# Patient Record
Sex: Male | Born: 1945 | Race: White | Hispanic: No | Marital: Married | Smoking: Never smoker
Health system: Southern US, Community
[De-identification: ages and names within clinical notes are randomized; demographics above are authoritative.]

## PROBLEM LIST (undated history)

## (undated) DIAGNOSIS — E7800 Pure hypercholesterolemia, unspecified: Secondary | ICD-10-CM

## (undated) DIAGNOSIS — I219 Acute myocardial infarction, unspecified: Secondary | ICD-10-CM

## (undated) DIAGNOSIS — K635 Polyp of colon: Secondary | ICD-10-CM

## (undated) DIAGNOSIS — E119 Type 2 diabetes mellitus without complications: Secondary | ICD-10-CM

## (undated) DIAGNOSIS — C801 Malignant (primary) neoplasm, unspecified: Secondary | ICD-10-CM

## (undated) DIAGNOSIS — I2510 Atherosclerotic heart disease of native coronary artery without angina pectoris: Secondary | ICD-10-CM

## (undated) DIAGNOSIS — C61 Malignant neoplasm of prostate: Secondary | ICD-10-CM

## (undated) HISTORY — PX: COLON SURGERY: SHX602

---

## 2015-12-13 DIAGNOSIS — M1711 Unilateral primary osteoarthritis, right knee: Secondary | ICD-10-CM | POA: Insufficient documentation

## 2015-12-13 DIAGNOSIS — M545 Low back pain, unspecified: Secondary | ICD-10-CM | POA: Insufficient documentation

## 2015-12-13 DIAGNOSIS — M25562 Pain in left knee: Secondary | ICD-10-CM | POA: Insufficient documentation

## 2015-12-13 DIAGNOSIS — R251 Tremor, unspecified: Secondary | ICD-10-CM | POA: Insufficient documentation

## 2015-12-13 DIAGNOSIS — R0989 Other specified symptoms and signs involving the circulatory and respiratory systems: Secondary | ICD-10-CM | POA: Insufficient documentation

## 2015-12-13 DIAGNOSIS — I209 Angina pectoris, unspecified: Secondary | ICD-10-CM | POA: Insufficient documentation

## 2015-12-13 DIAGNOSIS — M2022 Hallux rigidus, left foot: Secondary | ICD-10-CM

## 2015-12-13 DIAGNOSIS — M159 Polyosteoarthritis, unspecified: Secondary | ICD-10-CM | POA: Insufficient documentation

## 2015-12-13 DIAGNOSIS — J309 Allergic rhinitis, unspecified: Secondary | ICD-10-CM | POA: Insufficient documentation

## 2015-12-13 DIAGNOSIS — M25519 Pain in unspecified shoulder: Secondary | ICD-10-CM | POA: Insufficient documentation

## 2015-12-13 DIAGNOSIS — E1142 Type 2 diabetes mellitus with diabetic polyneuropathy: Secondary | ICD-10-CM | POA: Insufficient documentation

## 2015-12-13 DIAGNOSIS — M2021 Hallux rigidus, right foot: Secondary | ICD-10-CM | POA: Insufficient documentation

## 2015-12-13 DIAGNOSIS — L301 Dyshidrosis [pompholyx]: Secondary | ICD-10-CM | POA: Insufficient documentation

## 2015-12-13 DIAGNOSIS — I2510 Atherosclerotic heart disease of native coronary artery without angina pectoris: Secondary | ICD-10-CM | POA: Insufficient documentation

## 2015-12-13 DIAGNOSIS — R5383 Other fatigue: Secondary | ICD-10-CM | POA: Insufficient documentation

## 2015-12-13 DIAGNOSIS — M75101 Unspecified rotator cuff tear or rupture of right shoulder, not specified as traumatic: Secondary | ICD-10-CM | POA: Insufficient documentation

## 2015-12-13 DIAGNOSIS — R413 Other amnesia: Secondary | ICD-10-CM | POA: Insufficient documentation

## 2015-12-13 DIAGNOSIS — J449 Chronic obstructive pulmonary disease, unspecified: Secondary | ICD-10-CM | POA: Insufficient documentation

## 2016-02-09 DIAGNOSIS — E119 Type 2 diabetes mellitus without complications: Secondary | ICD-10-CM | POA: Insufficient documentation

## 2016-02-09 DIAGNOSIS — E11319 Type 2 diabetes mellitus with unspecified diabetic retinopathy without macular edema: Secondary | ICD-10-CM | POA: Insufficient documentation

## 2016-02-09 DIAGNOSIS — R002 Palpitations: Secondary | ICD-10-CM | POA: Insufficient documentation

## 2016-02-09 DIAGNOSIS — I10 Essential (primary) hypertension: Secondary | ICD-10-CM | POA: Insufficient documentation

## 2016-03-08 DIAGNOSIS — E785 Hyperlipidemia, unspecified: Secondary | ICD-10-CM | POA: Insufficient documentation

## 2016-03-08 DIAGNOSIS — N189 Chronic kidney disease, unspecified: Secondary | ICD-10-CM | POA: Insufficient documentation

## 2016-08-01 DIAGNOSIS — F321 Major depressive disorder, single episode, moderate: Secondary | ICD-10-CM | POA: Insufficient documentation

## 2016-08-01 DIAGNOSIS — E663 Overweight: Secondary | ICD-10-CM | POA: Insufficient documentation

## 2016-09-21 DIAGNOSIS — L4050 Arthropathic psoriasis, unspecified: Secondary | ICD-10-CM | POA: Insufficient documentation

## 2016-10-10 DIAGNOSIS — R972 Elevated prostate specific antigen [PSA]: Secondary | ICD-10-CM | POA: Insufficient documentation

## 2016-10-10 DIAGNOSIS — N529 Male erectile dysfunction, unspecified: Secondary | ICD-10-CM | POA: Insufficient documentation

## 2018-05-13 DIAGNOSIS — C61 Malignant neoplasm of prostate: Secondary | ICD-10-CM | POA: Insufficient documentation

## 2018-09-18 DIAGNOSIS — M5412 Radiculopathy, cervical region: Secondary | ICD-10-CM | POA: Insufficient documentation

## 2018-09-18 DIAGNOSIS — M25531 Pain in right wrist: Secondary | ICD-10-CM | POA: Insufficient documentation

## 2018-10-18 ENCOUNTER — Other Ambulatory Visit: Payer: Self-pay

## 2018-10-18 ENCOUNTER — Emergency Department (HOSPITAL_BASED_OUTPATIENT_CLINIC_OR_DEPARTMENT_OTHER)
Admission: EM | Admit: 2018-10-18 | Discharge: 2018-10-18 | Disposition: A | Discharge status: Home / Self Care | Payer: Medicare Other | Attending: Emergency Medicine | Admitting: Emergency Medicine

## 2018-10-18 ENCOUNTER — Encounter (HOSPITAL_BASED_OUTPATIENT_CLINIC_OR_DEPARTMENT_OTHER): Payer: Self-pay | Admitting: *Deleted

## 2018-10-18 ENCOUNTER — Emergency Department (HOSPITAL_BASED_OUTPATIENT_CLINIC_OR_DEPARTMENT_OTHER): Payer: Medicare Other

## 2018-10-18 DIAGNOSIS — Z7982 Long term (current) use of aspirin: Secondary | ICD-10-CM | POA: Insufficient documentation

## 2018-10-18 DIAGNOSIS — Z8546 Personal history of malignant neoplasm of prostate: Secondary | ICD-10-CM | POA: Insufficient documentation

## 2018-10-18 DIAGNOSIS — Z79899 Other long term (current) drug therapy: Secondary | ICD-10-CM | POA: Diagnosis not present

## 2018-10-18 DIAGNOSIS — I96 Gangrene, not elsewhere classified: Secondary | ICD-10-CM | POA: Insufficient documentation

## 2018-10-18 DIAGNOSIS — E119 Type 2 diabetes mellitus without complications: Secondary | ICD-10-CM | POA: Insufficient documentation

## 2018-10-18 DIAGNOSIS — Z7984 Long term (current) use of oral hypoglycemic drugs: Secondary | ICD-10-CM | POA: Insufficient documentation

## 2018-10-18 DIAGNOSIS — M79674 Pain in right toe(s): Secondary | ICD-10-CM | POA: Diagnosis present

## 2018-10-18 HISTORY — DX: Malignant (primary) neoplasm, unspecified: C80.1

## 2018-10-18 HISTORY — DX: Polyp of colon: K63.5

## 2018-10-18 HISTORY — DX: Pure hypercholesterolemia, unspecified: E78.00

## 2018-10-18 HISTORY — DX: Malignant neoplasm of prostate: C61

## 2018-10-18 HISTORY — DX: Acute myocardial infarction, unspecified: I21.9

## 2018-10-18 HISTORY — DX: Type 2 diabetes mellitus without complications: E11.9

## 2018-10-18 HISTORY — DX: Atherosclerotic heart disease of native coronary artery without angina pectoris: I25.10

## 2018-10-18 LAB — BASIC METABOLIC PANEL
Anion gap: 7 (ref 5–15)
BUN: 24 mg/dL — ABNORMAL HIGH (ref 8–23)
CO2: 21 mmol/L — ABNORMAL LOW (ref 22–32)
Calcium: 8.7 mg/dL — ABNORMAL LOW (ref 8.9–10.3)
Chloride: 111 mmol/L (ref 98–111)
Creatinine, Ser: 1.56 mg/dL — ABNORMAL HIGH (ref 0.61–1.24)
GFR calc Af Amer: 50 mL/min — ABNORMAL LOW (ref >60)
GFR calc non Af Amer: 43 mL/min — ABNORMAL LOW (ref >60)
Glucose, Bld: 188 mg/dL — ABNORMAL HIGH (ref 70–99)
Potassium: 4.2 mmol/L (ref 3.5–5.1)
Sodium: 139 mmol/L (ref 135–145)

## 2018-10-18 LAB — CBC WITH DIFFERENTIAL/PLATELET
Abs Immature Granulocytes: 0.03 10*3/uL (ref 0.00–0.07)
Basophils Absolute: 0 10*3/uL (ref 0.0–0.1)
Basophils Relative: 0 %
Eosinophils Absolute: 0.2 10*3/uL (ref 0.0–0.5)
Eosinophils Relative: 2 %
HCT: 41.1 % (ref 39.0–52.0)
Hemoglobin: 13.2 g/dL (ref 13.0–17.0)
Immature Granulocytes: 0 %
Lymphocytes Relative: 14 %
Lymphs Abs: 1.4 10*3/uL (ref 0.7–4.0)
MCH: 32.1 pg (ref 26.0–34.0)
MCHC: 32.1 g/dL (ref 30.0–36.0)
MCV: 100 fL (ref 80.0–100.0)
Monocytes Absolute: 0.9 10*3/uL (ref 0.1–1.0)
Monocytes Relative: 8 %
Neutro Abs: 7.6 10*3/uL (ref 1.7–7.7)
Neutrophils Relative %: 76 %
Platelets: 260 10*3/uL (ref 150–400)
RBC: 4.11 MIL/uL — ABNORMAL LOW (ref 4.22–5.81)
RDW: 13.3 % (ref 11.5–15.5)
WBC: 10.2 10*3/uL (ref 4.0–10.5)
nRBC: 0 % (ref 0.0–0.2)

## 2018-10-18 MED ORDER — DOXYCYCLINE HYCLATE 100 MG PO CAPS: 100.0000 mg | ORAL_CAPSULE | Freq: Two times a day (BID) | ORAL | 0 refills | Status: AC

## 2018-10-18 NOTE — ED Notes (Signed)
Patient has his own post-op shoe.

## 2018-10-18 NOTE — ED Provider Notes (Signed)
[facility] EMERGENCY DEPARTMENT Provider Note   CSN: [id number] Arrival date & time: 10/18/18  1312     History   Chief Complaint Chief Complaint  Patient presents with  . Wound Check    HPI Tyler Vazquez is a 73 y.o. male with history of CAD, diabetes who presents with 1 week history of right toe pain.  Patient hit it on a door stop.    Patient's toe has began to turn black and began draining today. He went to urgent care yesterday and had an x-ray which was apparently negative, however I am unable to find it on chart review. He was told yesterday that he needed to see a wound care center and was prescribed antibiotics, which he has not filled yet.  He is unsure what was prescribed to him.  He reports having pain when he walks to his toe.  He denies any fevers, chest pain, shortness of breath, abdominal pain, nausea, vomiting.  HPI  Past Medical History:  Diagnosis Date  . Cancer ([place])   . Colon polyps   . Coronary artery disease   . Diabetes mellitus without complication ([place])   . Heart attack ([place])   . High cholesterol   . Prostate cancer ([place])     There are no active problems to display for this patient.   Past Surgical History:  Procedure Laterality Date  . COLON SURGERY          Home Medications    Prior to Admission medications   Medication Sig Start Date End Date Taking? Authorizing Provider  aspirin EC 81 MG tablet Take 81 mg by mouth daily.   Yes [provider]  glimepiride (AMARYL) 2 MG tablet Take 2 mg by mouth at bedtime.   Yes [provider]  glimepiride (AMARYL) 4 MG tablet Take 4 mg by mouth daily with breakfast.   Yes [provider]  metoprolol tartrate (LOPRESSOR) 25 MG tablet Take 25 mg by mouth daily.   Yes [provider]  sitaGLIPtin-metformin (JANUMET) 50-1000 MG tablet Take 1 tablet by mouth 2 (two) times daily with a meal.   Yes [provider]  doxycycline (VIBRAMYCIN) 100 MG capsule  Take 1 capsule (100 mg total) by mouth 2 (two) times daily. 10/18/18   Frederica Kuster, PA-C    Family History No family history on file.  Social History Social History   Tobacco Use  . Smoking status: Never Smoker  . Smokeless tobacco: Never Used  Substance Use Topics  . Alcohol use: Never    Frequency: Never  . Drug use: Never     Allergies   Codeine   Review of Systems Review of Systems  Constitutional: Negative for chills and fever.  HENT: Negative for facial swelling and sore throat.   Respiratory: Negative for shortness of breath.   Cardiovascular: Negative for chest pain.  Gastrointestinal: Negative for abdominal pain, nausea and vomiting.  Genitourinary: Negative for dysuria.  Musculoskeletal: Negative for back pain.  Skin: Positive for color change and wound. Negative for rash.  Neurological: Negative for headaches.  Psychiatric/Behavioral: The patient is not nervous/anxious.      Physical Exam Updated Vital Signs BP 105/82 (BP Location: Right Arm)   Pulse 99   Temp 97.9 F (36.6 C) (Oral)   Resp 16   Ht 5\' 4"  (1.626 m)   Wt 74.8 kg   SpO2 98%   BMI 28.32 kg/m   Physical Exam Vitals signs and nursing note reviewed.  Constitutional:      General: He is not in acute distress.    Appearance: He is well-developed. He is not diaphoretic.  HENT:     Head: Normocephalic and atraumatic.     Mouth/Throat:     Pharynx: No oropharyngeal exudate.  Eyes:     General: No scleral icterus.       Right eye: No discharge.        Left eye: No discharge.     Conjunctiva/sclera: Conjunctivae normal.     Pupils: Pupils are equal, round, and reactive to light.  Neck:     Musculoskeletal: Normal range of motion and neck supple.     Thyroid: No thyromegaly.  Cardiovascular:     Rate and Rhythm: Normal rate and regular rhythm.     Heart sounds: Normal heart sounds. No murmur. No friction rub. No gallop.   Pulmonary:     Effort: Pulmonary effort is normal. No  respiratory distress.     Breath sounds: Normal breath sounds. No stridor. No wheezing or rales.  Abdominal:     General: Bowel sounds are normal. There is no distension.     Palpations: Abdomen is soft.     Tenderness: There is no abdominal tenderness. There is no guarding or rebound.  Musculoskeletal:     Comments: Tenderness, erythema, and lack area to medial aspect of right fifth digit, no active drainage DP pulses intact bilaterally  Lymphadenopathy:     Cervical: No cervical adenopathy.  Skin:    General: Skin is warm and dry.     Coloration: Skin is not pale.     Findings: No rash.  Neurological:     Mental Status: He is alert.     Coordination: Coordination normal.          ED Treatments / Results  Labs (all labs ordered are listed, but only abnormal results are displayed) Labs Reviewed  BASIC METABOLIC PANEL - Abnormal; Notable for the following components:      Result Value   CO2 21 (*)    Glucose, Bld 188 (*)    BUN 24 (*)    Creatinine, Ser 1.56 (*)    Calcium 8.7 (*)    GFR calc non Af Amer 43 (*)    GFR calc Af Amer 50 (*)    All other components within normal limits  CBC WITH DIFFERENTIAL/PLATELET - Abnormal; Notable for the following components:   RBC 4.11 (*)    All other components within normal limits    EKG None  Radiology Dg Toe 5th Right  Result Date: 10/18/2018 CLINICAL DATA:  The patient suffered a blow to the right fifth toe on a piece of metal today. Initial encounter. EXAM: RIGHT FIFTH TOE COMPARISON:  None. FINDINGS: There is no evidence of fracture or dislocation. There is no evidence of arthropathy or other focal bone abnormality. Soft tissues are unremarkable. IMPRESSION: Negative exam. Electronically Signed   By: Inge Rise M.D.   On: 10/18/2018 15:36    Procedures Procedures (including critical care time)  Medications Ordered in ED Medications - No data to display   Initial Impression / Assessment and Plan / ED Course    I have reviewed the triage vital signs and the nursing notes.  Pertinent labs & imaging results that were available during my care of the patient were reviewed by me and considered in my medical decision making (see chart for details).     Patient presenting with necrotic appearing right fifth  digit.  Labs are stable for the patient.  There is no leukocytosis.  He is afebrile and shows no systemic signs of infection.  X-ray is negative.  Will treat with doxycycline with close follow-up to podiatry as well as wound care.  Patient has a postop shoe at home.  He denies any pain unless he is walking on it.  Return precautions discussed.  Patient understands and agrees with plan.  Patient vital stable throughout ED course and discharged in satisfactory condition.  Patient also evaluated by my attending, Dr. Reather Converse, who guided the patient's management and agrees with plan.  Final Clinical Impressions(s) / ED Diagnoses   Final diagnoses:  Toe necrosis [facility])    ED Discharge Orders         Ordered    doxycycline (VIBRAMYCIN) 100 MG capsule  2 times daily     10/18/18 [address] [place] 10/18/18 1657    Elnora Morrison, MD 10/18/18 [phone]

## 2018-10-18 NOTE — ED Triage Notes (Signed)
Pt reports injured right 5th pinkie toe over a week ago. States now has draining wound. He went to Urgent Care and was told the toe was "dead". Pt is a diabetic

## 2018-10-18 NOTE — Discharge Instructions (Signed)
Take doxycycline until completed.  Please follow-up with Dr. Marla Roe, wound care doctor, as well as the podiatrist, Dr. Jacqualyn Posey, for further evaluation and treatment of your toe.  It is important that you follow-up with these people to prevent spread of tissue death.  You may end up needing an amputation of your toe.  Please return to emergency department if develop increasing pain or redness spreading of your foot, or fevers over 100.4.

## 2018-10-21 ENCOUNTER — Other Ambulatory Visit: Payer: Self-pay

## 2018-10-21 ENCOUNTER — Ambulatory Visit: Payer: Medicare Other | Admitting: Podiatry

## 2018-10-21 ENCOUNTER — Encounter: Payer: Self-pay | Admitting: Podiatry

## 2018-10-21 VITALS — BP 117/68 | HR 77 | Temp 98.1°F | Resp 16

## 2018-10-21 DIAGNOSIS — L02611 Cutaneous abscess of right foot: Secondary | ICD-10-CM

## 2018-10-21 DIAGNOSIS — S91104A Unspecified open wound of right lesser toe(s) without damage to nail, initial encounter: Secondary | ICD-10-CM | POA: Diagnosis not present

## 2018-10-21 DIAGNOSIS — S9031XA Contusion of right foot, initial encounter: Secondary | ICD-10-CM | POA: Diagnosis not present

## 2018-10-21 DIAGNOSIS — L03031 Cellulitis of right toe: Secondary | ICD-10-CM | POA: Diagnosis not present

## 2018-10-21 DIAGNOSIS — M79674 Pain in right toe(s): Secondary | ICD-10-CM

## 2018-10-21 DIAGNOSIS — M7989 Other specified soft tissue disorders: Secondary | ICD-10-CM

## 2018-10-21 MED ORDER — SILVER SULFADIAZINE 1 % EX CREA: TOPICAL_CREAM | CUTANEOUS | 0 refills | Status: AC

## 2018-10-22 DIAGNOSIS — E11628 Type 2 diabetes mellitus with other skin complications: Secondary | ICD-10-CM | POA: Insufficient documentation

## 2018-10-22 DIAGNOSIS — L089 Local infection of the skin and subcutaneous tissue, unspecified: Secondary | ICD-10-CM

## 2018-10-22 NOTE — Progress Notes (Signed)
Subjective:  Patient ID: Tyler Vazquez, male    DOB: 06/10/1946,  MRN: [id number]  Chief Complaint  Patient presents with  . Toe Pain    R 5th toe dislcored, redness, and swollen x 1 wk; 10/10 sharp, burning, tinling sensation -pt states it started after he hit his toe on a door Tx: hemp and aspercreme -pt denies N/V/F/Ch -w/ bloody draiange -FBS: 165 A1C: 7 PCP: Spry x months    73 y.o. male presents for wound care.  States that he hit his toe on a car door which caused the toe to become bruised and black reports pain worse at night.  Was seen in urgent care x-rays were taken no signs of fracture or erosion.  Was referred here by wound care center who advised that the toe may need to be removed.  Review of Systems: Negative except as noted in the HPI. Denies N/V/F/Ch.  Past Medical History:  Diagnosis Date  . Cancer ([place])   . Colon polyps   . Coronary artery disease   . Diabetes mellitus without complication ([place])   . Heart attack ([place])   . High cholesterol   . Prostate cancer [facility])     Current Outpatient Medications:  .  aspirin EC 81 MG tablet, Take 81 mg by mouth daily., Disp: , Rfl:  .  azelastine (ASTELIN) 0.1 % nasal spray, Place into the nose., Disp: , Rfl:  .  B Complex Vitamins (VITAMIN B-COMPLEX) TABS, Take by mouth., Disp: , Rfl:  .  clotrimazole-betamethasone (LOTRISONE) cream, Apply topically., Disp: , Rfl:  .  diclofenac sodium (VOLTAREN) 1 % GEL, Apply up to 4gm of gel to region of pain up to 4 times a day, Disp: , Rfl:  .  doxycycline (VIBRAMYCIN) 100 MG capsule, Take 1 capsule (100 mg total) by mouth 2 (two) times daily., Disp: 20 capsule, Rfl: 0 .  DULoxetine (CYMBALTA) 30 MG capsule, Take by mouth., Disp: , Rfl:  .  glimepiride (AMARYL) 2 MG tablet, Take 2 mg by mouth at bedtime., Disp: , Rfl:  .  glimepiride (AMARYL) 4 MG tablet, Take 4 mg by mouth daily with breakfast., Disp: , Rfl:  .  glucose blood (PRECISION QID TEST) test strip, Test daily dx dm type 2,  Disp: , Rfl:  .  leflunomide (ARAVA) 10 MG tablet, Take by mouth., Disp: , Rfl:  .  magnesium oxide (MAG-OX) 400 MG tablet, Take by mouth., Disp: , Rfl:  .  metoprolol tartrate (LOPRESSOR) 25 MG tablet, Take 25 mg by mouth daily., Disp: , Rfl:  .  nitroGLYCERIN (NITROSTAT) 0.4 MG SL tablet, Place under the tongue., Disp: , Rfl:  .  rosuvastatin (CRESTOR) 10 MG tablet, TAKE 1 TABLET BY MOUTH NIGHTLY, Disp: , Rfl:  .  sildenafil (REVATIO) 20 MG tablet, Take 1 to 5 tablets 1 hour before sex.  Do not take nitrates., Disp: , Rfl:  .  sitaGLIPtin-metformin (JANUMET) 50-1000 MG tablet, Take 1 tablet by mouth 2 (two) times daily with a meal., Disp: , Rfl:  .  triamcinolone cream (KENALOG) 0.1 %, Apply topically., Disp: , Rfl:  .  silver sulfADIAZINE (SILVADENE) 1 % cream, Apply pea-sized amount to wound every other day. Cover with band-aid., Disp: 50 g, Rfl: 0  Social History   Tobacco Use  Smoking Status Never Smoker  Smokeless Tobacco Never Used    Allergies  Allergen Reactions  . Bufferin  [Buffered Aspirin] Anaphylaxis  . Codeine Swelling  . Gabapentin Rash    Other  reaction(s): Tremor (intolerance) Other reaction(s): Tremor (intolerance)    Objective:   Vitals:   10/21/18 1015  BP: 117/68  Pulse: 77  Resp: 16  Temp: 98.1 F (36.7 C)   There is no height or weight on file to calculate BMI. Constitutional Well developed. Well nourished.  Vascular Dorsalis pedis pulses palpable bilaterally. Posterior tibial pulses palpable bilaterally. Capillary refill normal to all digits.  No cyanosis or clubbing noted. Pedal hair growth normal.  Neurologic Normal speech. Oriented to person, place, and time. Protective sensation intact  Dermatologic N.B.: Pictures taken at time of visit did not upload, these images from hospital encounter 10/18/2018   Right fifth toe medial unstageable eschar slight periwound contusion wound appears with some fibrosis.  No excessive warmth some periwound  redness though this is likely more due to bruising  Orthopedic:  Pain to palpation about the right fifth toe   Radiographs: X-rays reviewed from the hospital.  No osseous erosions Assessment:   1. Contusion of right foot, initial encounter   2. Open wound of fifth toe of right foot, initial encounter   3. Pain and swelling of toe, right   4. Cellulitis and abscess of toe of right foot    Plan:  Patient was evaluated and treated and all questions answered.  Deep contusion right foot, wound right foot -X-rays reviewed -I discussed with patient that at this time I do not think the toe needs to be removed.  At this point we can proceed with close monitoring of the digit to see if the eschar will resolve with good skin underneath the toe.  He has good pulses and good pedal hair and I think from a vascular perspective there is a chance that the toe can heal without amputation.  I still did discuss that there is a chance that the toe may not survive he may need to undergo amputation.  He may need to undergo debridement to promote healing. -Rx Keflex prophylactically -Dressed with Silvadene and Band-Aid -Rx Silvadene.  Advised to apply every other day. -Would consider switching to Santyl at next visit if eschar remains.   No follow-ups on file.

## 2018-10-23 ENCOUNTER — Telehealth: Payer: Self-pay | Admitting: Podiatry

## 2018-10-23 MED ORDER — TRAMADOL HCL 50 MG PO TABS: 50.0000 mg | ORAL_TABLET | Freq: Three times a day (TID) | ORAL | 0 refills | Status: AC | PRN

## 2018-10-23 NOTE — Telephone Encounter (Signed)
Patient's wife left voicemail Wednesday evening stating that her husband is still in pain and is now requesting pain rx. He was seen in [place] by Dr. March Rummage on 10/21/18. Please advise.Tyler KitchenMarland Vazquez

## 2018-10-23 NOTE — Addendum Note (Signed)
Addended by: Hardie Pulley on: 10/23/2018 08:38 AM   Modules accepted: Orders

## 2018-10-23 NOTE — Telephone Encounter (Signed)
Dr. March Rummage Please advise.  No mention in note.

## 2018-10-23 NOTE — Telephone Encounter (Signed)
Notified patient that an Rx was sent to his pharmacy

## 2018-10-28 ENCOUNTER — Encounter: Payer: Self-pay | Admitting: Podiatry

## 2018-10-28 ENCOUNTER — Ambulatory Visit: Payer: Medicare Other | Admitting: Podiatry

## 2018-10-28 VITALS — BP 124/74 | HR 80 | Temp 96.9°F | Resp 16

## 2018-10-28 DIAGNOSIS — L03031 Cellulitis of right toe: Secondary | ICD-10-CM

## 2018-10-28 DIAGNOSIS — M79674 Pain in right toe(s): Secondary | ICD-10-CM | POA: Diagnosis not present

## 2018-10-28 DIAGNOSIS — S9031XD Contusion of right foot, subsequent encounter: Secondary | ICD-10-CM

## 2018-10-28 DIAGNOSIS — L02611 Cutaneous abscess of right foot: Secondary | ICD-10-CM

## 2018-10-28 DIAGNOSIS — S91104D Unspecified open wound of right lesser toe(s) without damage to nail, subsequent encounter: Secondary | ICD-10-CM | POA: Diagnosis not present

## 2018-10-28 DIAGNOSIS — M7989 Other specified soft tissue disorders: Secondary | ICD-10-CM

## 2018-10-28 NOTE — Progress Notes (Signed)
Subjective:  Patient ID: Tyler Vazquez, male    DOB: 1946/07/30,  MRN: [id number]  Chief Complaint  Patient presents with  . Wound Check    F/U R 5th toe contusion and wound check Pt.s tates," doing better. I don't hurt like I did (1-2/10 sorness). It's starting to look a little better." Tx: keflex, silvadene and vaseline -pt denies N/V?F/Ch -w/ bloody drainage, toe discoloration and redness    73 y.o. male presents for wound care. History above confirmed with patient.  Review of Systems: Negative except as noted in the HPI. Denies N/V/F/Ch.  Past Medical History:  Diagnosis Date  . Cancer ([place])   . Colon polyps   . Coronary artery disease   . Diabetes mellitus without complication ([place])   . Heart attack ([place])   . High cholesterol   . Prostate cancer [facility])     Current Outpatient Medications:  .  aspirin EC 81 MG tablet, Take 81 mg by mouth daily., Disp: , Rfl:  .  azelastine (ASTELIN) 0.1 % nasal spray, Place into the nose., Disp: , Rfl:  .  B Complex Vitamins (VITAMIN B-COMPLEX) TABS, Take by mouth., Disp: , Rfl:  .  clotrimazole-betamethasone (LOTRISONE) cream, Apply topically., Disp: , Rfl:  .  diclofenac sodium (VOLTAREN) 1 % GEL, Apply up to 4gm of gel to region of pain up to 4 times a day, Disp: , Rfl:  .  doxycycline (VIBRAMYCIN) 100 MG capsule, Take 1 capsule (100 mg total) by mouth 2 (two) times daily., Disp: 20 capsule, Rfl: 0 .  DULoxetine (CYMBALTA) 30 MG capsule, Take by mouth., Disp: , Rfl:  .  glimepiride (AMARYL) 2 MG tablet, Take 2 mg by mouth at bedtime., Disp: , Rfl:  .  glimepiride (AMARYL) 4 MG tablet, Take 4 mg by mouth daily with breakfast., Disp: , Rfl:  .  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  leflunomide (ARAVA) 10 MG tablet, Take by mouth., Disp: , Rfl:  .  magnesium oxide (MAG-OX) 400 MG tablet, Take by mouth., Disp: , Rfl:  .  metoprolol tartrate (LOPRESSOR) 25 MG tablet, Take 25 mg by mouth daily.,  Disp: , Rfl:  .  nitroGLYCERIN (NITROSTAT) 0.4 MG SL tablet, Place under the tongue., Disp: , Rfl:  .  rosuvastatin (CRESTOR) 10 MG tablet, TAKE 1 TABLET BY MOUTH NIGHTLY, Disp: , Rfl:  .  sildenafil (REVATIO) 20 MG tablet, Take 1 to 5 tablets 1 hour before sex.  Do not take nitrates., Disp: , Rfl:  .  silver sulfADIAZINE (SILVADENE) 1 % cream, Apply pea-sized amount to wound every other day. Cover with band-aid., Disp: 50 g, Rfl: 0 .  sitaGLIPtin-metformin (JANUMET) 50-1000 MG tablet, Take 1 tablet by mouth 2 (two) times daily with a meal., Disp: , Rfl:  .  traMADol (ULTRAM) 50 MG tablet, Take 1 tablet (50 mg total) by mouth every 8 (eight) hours as needed., Disp: 15 tablet, Rfl: 0 .  triamcinolone cream (KENALOG) 0.1 %, Apply topically., Disp: , Rfl:   Social History   Tobacco Use  Smoking Status Never Smoker  Smokeless Tobacco Never Used    Allergies  Allergen Reactions  . Bufferin  [Buffered Aspirin] Anaphylaxis  . Codeine Swelling  . Gabapentin Rash    Other reaction(s): Tremor (intolerance) Other reaction(s): Tremor (intolerance)    Objective:   Vitals:   10/28/18 1119  BP: 124/74  Pulse: 80  Resp: 16  Temp: (!)  96.9 F (36.1 C)   There is no height or weight on file to calculate BMI. Constitutional Well developed. Well nourished.  Vascular Dorsalis pedis pulses palpable bilaterally. Posterior tibial pulses palpable bilaterally. Capillary refill normal to all digits.  No cyanosis or clubbing noted. Pedal hair growth normal.  Neurologic Normal speech. Oriented to person, place, and time. Protective sensation intact  Dermatologic  Right 5th toe wound improving, unstageable. Eschar remains. No warmth or erythema. No signs of acute infection. Psoriasis plaque plantar right foot.  Orthopedic: Pain to palpation about the right fifth toe   Radiographs: None Assessment:   1. Open wound of fifth toe of right foot, subsequent encounter   2. Contusion of right foot,  subsequent encounter   3. Pain and swelling of toe, right   4. Cellulitis and abscess of toe of right foot    Plan:  Patient was evaluated and treated and all questions answered.  Deep contusion right foot, wound right foot -Wound appears to be healing. At this point I do not think we need to proceed with amputation. -Dressed with Silvadene and Band-Aid -Continue dressing with silvadene qOD -Would consider switching to Santyl at next visit if eschar remains slow to resolve.  Return in about 1 week (around 11/04/2018) for wound check right 5th toe.

## 2018-10-30 NOTE — Addendum Note (Signed)
Addended by: Allean Found on: 10/30/2018 08:31 AM   Modules accepted: Orders

## 2018-11-04 ENCOUNTER — Ambulatory Visit: Payer: Medicare Other | Admitting: Podiatry

## 2018-11-04 ENCOUNTER — Other Ambulatory Visit: Payer: Self-pay

## 2018-11-04 ENCOUNTER — Encounter: Payer: Self-pay | Admitting: Podiatry

## 2018-11-04 VITALS — BP 88/57 | HR 82 | Temp 97.1°F | Resp 16

## 2018-11-04 DIAGNOSIS — S91104D Unspecified open wound of right lesser toe(s) without damage to nail, subsequent encounter: Secondary | ICD-10-CM

## 2018-11-15 NOTE — Progress Notes (Signed)
Subjective:  Patient ID: Tyler Vazquez, male    DOB: Jun 01, 1946,  MRN: [id number]  Chief Complaint  Patient presents with  . Foot Ulcer    F/I L 5ht toe wound check Pt. sates," seems to be ding alright, it hurts a little; 6/10 intermittetn painm." tx: silvadene -pt deneis N/V/F?Ch     73 y.o. male presents for wound care. History above confirmed with patient.  Review of Systems: Negative except as noted in the HPI. Denies N/V/F/Ch.  Past Medical History:  Diagnosis Date  . Cancer ([place])   . Colon polyps   . Coronary artery disease   . Diabetes mellitus without complication ([place])   . Heart attack ([place])   . High cholesterol   . Prostate cancer [facility])     Current Outpatient Medications:  .  aspirin EC 81 MG tablet, Take 81 mg by mouth daily., Disp: , Rfl:  .  azelastine (ASTELIN) 0.1 % nasal spray, Place into the nose., Disp: , Rfl:  .  B Complex Vitamins (VITAMIN B-COMPLEX) TABS, Take by mouth., Disp: , Rfl:  .  clotrimazole-betamethasone (LOTRISONE) cream, Apply topically., Disp: , Rfl:  .  Coenzyme Q10 100 MG TABS, Take by mouth., Disp: , Rfl:  .  diclofenac sodium (VOLTAREN) 1 % GEL, Apply up to 4gm of gel to region of pain up to 4 times a day, Disp: , Rfl:  .  doxycycline (VIBRAMYCIN) 100 MG capsule, Take 1 capsule (100 mg total) by mouth 2 (two) times daily., Disp: 20 capsule, Rfl: 0 .  DULoxetine (CYMBALTA) 30 MG capsule, Take by mouth., Disp: , Rfl:  .  glimepiride (AMARYL) 2 MG tablet, Take 2 mg by mouth at bedtime., Disp: , Rfl:  .  glimepiride (AMARYL) 4 MG tablet, Take 4 mg by mouth daily with breakfast., Disp: , Rfl:  .  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  leflunomide (ARAVA) 10 MG tablet, Take by mouth., Disp: , Rfl:  .  magnesium oxide (MAG-OX) 400 MG tablet, Take by mouth., Disp: , Rfl:  .  metoprolol tartrate (LOPRESSOR) 25 MG tablet, Take 25 mg by mouth daily., Disp: , Rfl:  .  nitroGLYCERIN (NITROSTAT) 0.4 MG SL  tablet, Place under the tongue., Disp: , Rfl:  .  rosuvastatin (CRESTOR) 10 MG tablet, TAKE 1 TABLET BY MOUTH NIGHTLY, Disp: , Rfl:  .  sildenafil (REVATIO) 20 MG tablet, Take 1 to 5 tablets 1 hour before sex.  Do not take nitrates., Disp: , Rfl:  .  silver sulfADIAZINE (SILVADENE) 1 % cream, Apply pea-sized amount to wound every other day. Cover with band-aid., Disp: 50 g, Rfl: 0 .  sitaGLIPtin-metformin (JANUMET) 50-1000 MG tablet, Take 1 tablet by mouth 2 (two) times daily with a meal., Disp: , Rfl:  .  traMADol (ULTRAM) 50 MG tablet, Take 1 tablet (50 mg total) by mouth every 8 (eight) hours as needed., Disp: 15 tablet, Rfl: 0 .  triamcinolone cream (KENALOG) 0.1 %, Apply topically., Disp: , Rfl:   Social History   Tobacco Use  Smoking Status Never Smoker  Smokeless Tobacco Never Used    Allergies  Allergen Reactions  . Bufferin  [Buffered Aspirin] Anaphylaxis  . Codeine Swelling  . Gabapentin Rash    Other reaction(s): Tremor (intolerance) Other reaction(s): Tremor (intolerance)    Objective:   Vitals:   11/04/18 1127  BP: (!) 88/57  Pulse: 82  Resp: 16  Temp: (!) 97.1  F (36.2 C)   There is no height or weight on file to calculate BMI. Constitutional Well developed. Well nourished.  Vascular Dorsalis pedis pulses palpable bilaterally. Posterior tibial pulses palpable bilaterally. Capillary refill normal to all digits.  No cyanosis or clubbing noted. Pedal hair growth normal.  Neurologic Normal speech. Oriented to person, place, and time. Protective sensation intact  Dermatologic Right 5th toe wound improving, eschar resolving. No warmth or erythema. No signs of acute infection.  0.3 x 0.3 open wound following debridement Psoriasis plaque plantar right foot.  Orthopedic: Pain to palpation about the right fifth toe   Radiographs: None Assessment:   1. Open wound of fifth toe of right foot, subsequent encounter    Plan:  Patient was evaluated and treated and  all questions answered.  Deep contusion right foot, wound right foot -Wound continues to improve eschar appears resolving.  Gently debrided with tissue nipper -Likely will not need amputation.  Advised to follow-up promptly should also worsen  Procedure: Selective Debridement of Wound Rationale: Removal of devitalized tissue from the wound to promote healing.  Pre-Debridement Wound Measurements: 0.3 cm x 0.3 cm x 0.1 cm  Post-Debridement Wound Measurements: same as pre-debridement. Type of Debridement: sharp selective Tissue Removed: Devitalized soft-tissue Dressing: Dry, sterile, compression dressing. Disposition: Patient tolerated procedure well. Patient to return in 1 week for follow-up.    No follow-ups on file.

## 2018-11-18 ENCOUNTER — Encounter: Payer: Self-pay | Admitting: Podiatry

## 2018-11-18 ENCOUNTER — Ambulatory Visit: Payer: Medicare Other | Admitting: Podiatry

## 2018-11-18 VITALS — BP 129/75 | HR 88 | Temp 97.1°F | Resp 16

## 2018-11-18 DIAGNOSIS — S91104D Unspecified open wound of right lesser toe(s) without damage to nail, subsequent encounter: Secondary | ICD-10-CM | POA: Diagnosis not present

## 2018-11-18 NOTE — Progress Notes (Signed)
Subjective:  Patient ID: Tyler Vazquez, male    DOB: 1946/02/28,  MRN: [id number]  Chief Complaint  Patient presents with  . Wound Check    F/U R foot wound Pt.states," it's been doing alright and a little bit sore (5/10),, but it seems likt it's healing." tx: silvadene and bandaid -w/ more yellow draiange -FBS: 43   73 y.o. male presents for wound care. History above confirmed with patient.  Review of Systems: Negative except as noted in the HPI. Denies N/V/F/Ch.  Past Medical History:  Diagnosis Date  . Cancer ([place])   . Colon polyps   . Coronary artery disease   . Diabetes mellitus without complication ([place])   . Heart attack ([place])   . High cholesterol   . Prostate cancer [facility])     Current Outpatient Medications:  .  aspirin EC 81 MG tablet, Take 81 mg by mouth daily., Disp: , Rfl:  .  azelastine (ASTELIN) 0.1 % nasal spray, Place into the nose., Disp: , Rfl:  .  B Complex Vitamins (VITAMIN B-COMPLEX) TABS, Take by mouth., Disp: , Rfl:  .  clotrimazole-betamethasone (LOTRISONE) cream, Apply topically., Disp: , Rfl:  .  Coenzyme Q10 100 MG TABS, Take by mouth., Disp: , Rfl:  .  diclofenac sodium (VOLTAREN) 1 % GEL, Apply up to 4gm of gel to region of pain up to 4 times a day, Disp: , Rfl:  .  doxycycline (VIBRAMYCIN) 100 MG capsule, Take 1 capsule (100 mg total) by mouth 2 (two) times daily., Disp: 20 capsule, Rfl: 0 .  DULoxetine (CYMBALTA) 30 MG capsule, Take by mouth., Disp: , Rfl:  .  glimepiride (AMARYL) 2 MG tablet, Take 2 mg by mouth at bedtime., Disp: , Rfl:  .  glimepiride (AMARYL) 4 MG tablet, Take 4 mg by mouth daily with breakfast., Disp: , Rfl:  .  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  leflunomide (ARAVA) 10 MG tablet, Take by mouth., Disp: , Rfl:  .  magnesium oxide (MAG-OX) 400 MG tablet, Take by mouth., Disp: , Rfl:  .  metoprolol tartrate (LOPRESSOR) 25 MG tablet, Take 25 mg by mouth daily., Disp: , Rfl:  .   nitroGLYCERIN (NITROSTAT) 0.4 MG SL tablet, Place under the tongue., Disp: , Rfl:  .  rosuvastatin (CRESTOR) 10 MG tablet, TAKE 1 TABLET BY MOUTH NIGHTLY, Disp: , Rfl:  .  sildenafil (REVATIO) 20 MG tablet, Take 1 to 5 tablets 1 hour before sex.  Do not take nitrates., Disp: , Rfl:  .  silver sulfADIAZINE (SILVADENE) 1 % cream, Apply pea-sized amount to wound every other day. Cover with band-aid., Disp: 50 g, Rfl: 0 .  sitaGLIPtin-metformin (JANUMET) 50-1000 MG tablet, Take 1 tablet by mouth 2 (two) times daily with a meal., Disp: , Rfl:  .  traMADol (ULTRAM) 50 MG tablet, Take 1 tablet (50 mg total) by mouth every 8 (eight) hours as needed., Disp: 15 tablet, Rfl: 0 .  triamcinolone cream (KENALOG) 0.1 %, Apply topically., Disp: , Rfl:   Social History   Tobacco Use  Smoking Status Never Smoker  Smokeless Tobacco Never Used    Allergies  Allergen Reactions  . Bufferin  [Buffered Aspirin] Anaphylaxis  . Codeine Swelling  . Gabapentin Rash    Other reaction(s): Tremor (intolerance) Other reaction(s): Tremor (intolerance)    Objective:   Vitals:   11/18/18 1118  BP: 129/75  Pulse: 88  Resp: 16  Temp: (!) 97.1 F (36.2 C)   There is no height or weight on file to calculate BMI. Constitutional Well developed. Well nourished.  Vascular Dorsalis pedis pulses palpable bilaterally. Posterior tibial pulses palpable bilaterally. Capillary refill normal to all digits.  No cyanosis or clubbing noted. Pedal hair growth normal.  Neurologic Normal speech. Oriented to person, place, and time. Protective sensation intact  Dermatologic Right 5th toe wound improving, eschar resolving. No warmth or erythema. No signs of acute infection.  0.3 x 0.2 open wound following debridement Psoriasis plaque plantar right foot.  Orthopedic: Pain to palpation about the right fifth toe   Radiographs: None Assessment:   1. Open wound of fifth toe of right foot, subsequent encounter    Plan:    Patient was evaluated and treated and all questions answered.  Deep contusion right foot, wound right foot -Wound continues to improve. Wound base fibrogranular. Debrided and cauterized with silver nitrate.  Procedure: Selective Debridement of Wound Rationale: Removal of devitalized tissue from the wound to promote healing.  Pre-Debridement Wound Measurements: 0.3 cm x 0.2 cm x 0.1 cm  Post-Debridement Wound Measurements: same as pre-debridement. Type of Debridement: sharp selective Tissue Removed: Devitalized soft-tissue Dressing: Dry, sterile, compression dressing. Disposition: Patient tolerated procedure well. Patient to return in 1 week for follow-up.    Return in about 1 month (around 12/19/2018) for Wound check .

## 2018-12-23 ENCOUNTER — Ambulatory Visit: Payer: Medicare Other | Admitting: Podiatry

## 2018-12-23 ENCOUNTER — Other Ambulatory Visit: Payer: Self-pay

## 2020-11-25 IMAGING — CR DG TOE 5TH 2+V*R*
3 series · 3 of 3 positions shown · non-contrast
Comparison: None.

CLINICAL DATA: The patient suffered a blow to the right fifth toe
on a piece of metal today. Initial encounter.

EXAM:
RIGHT FIFTH TOE

[t toes ap right]
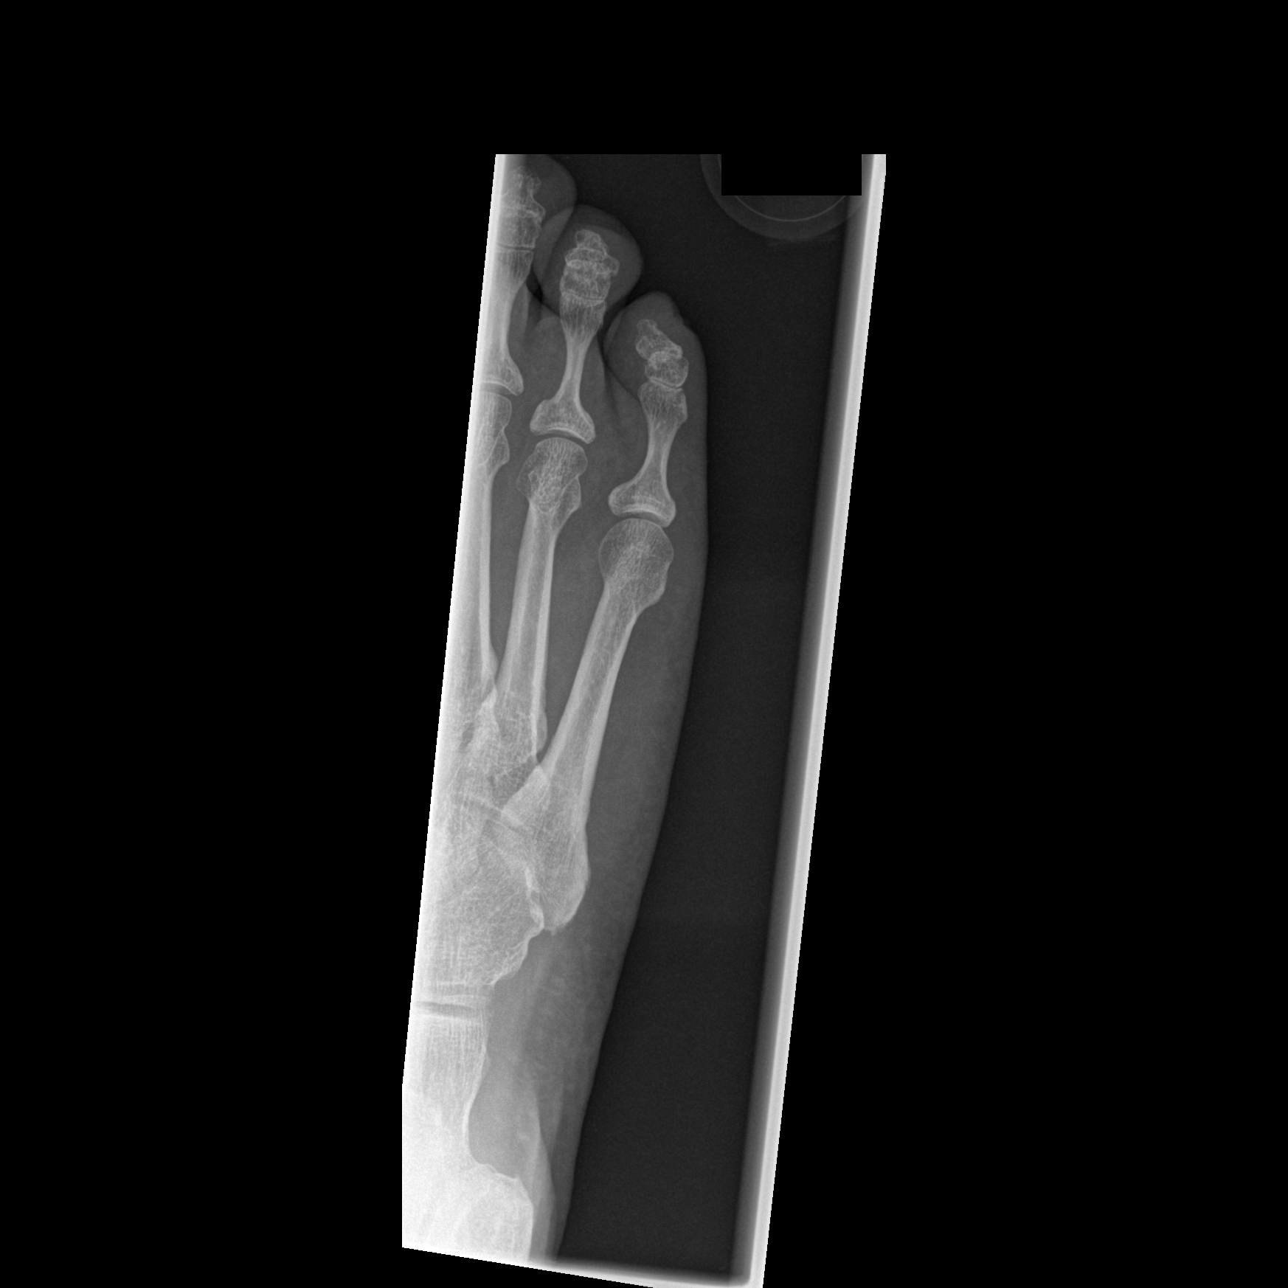

[t toes oblique right]
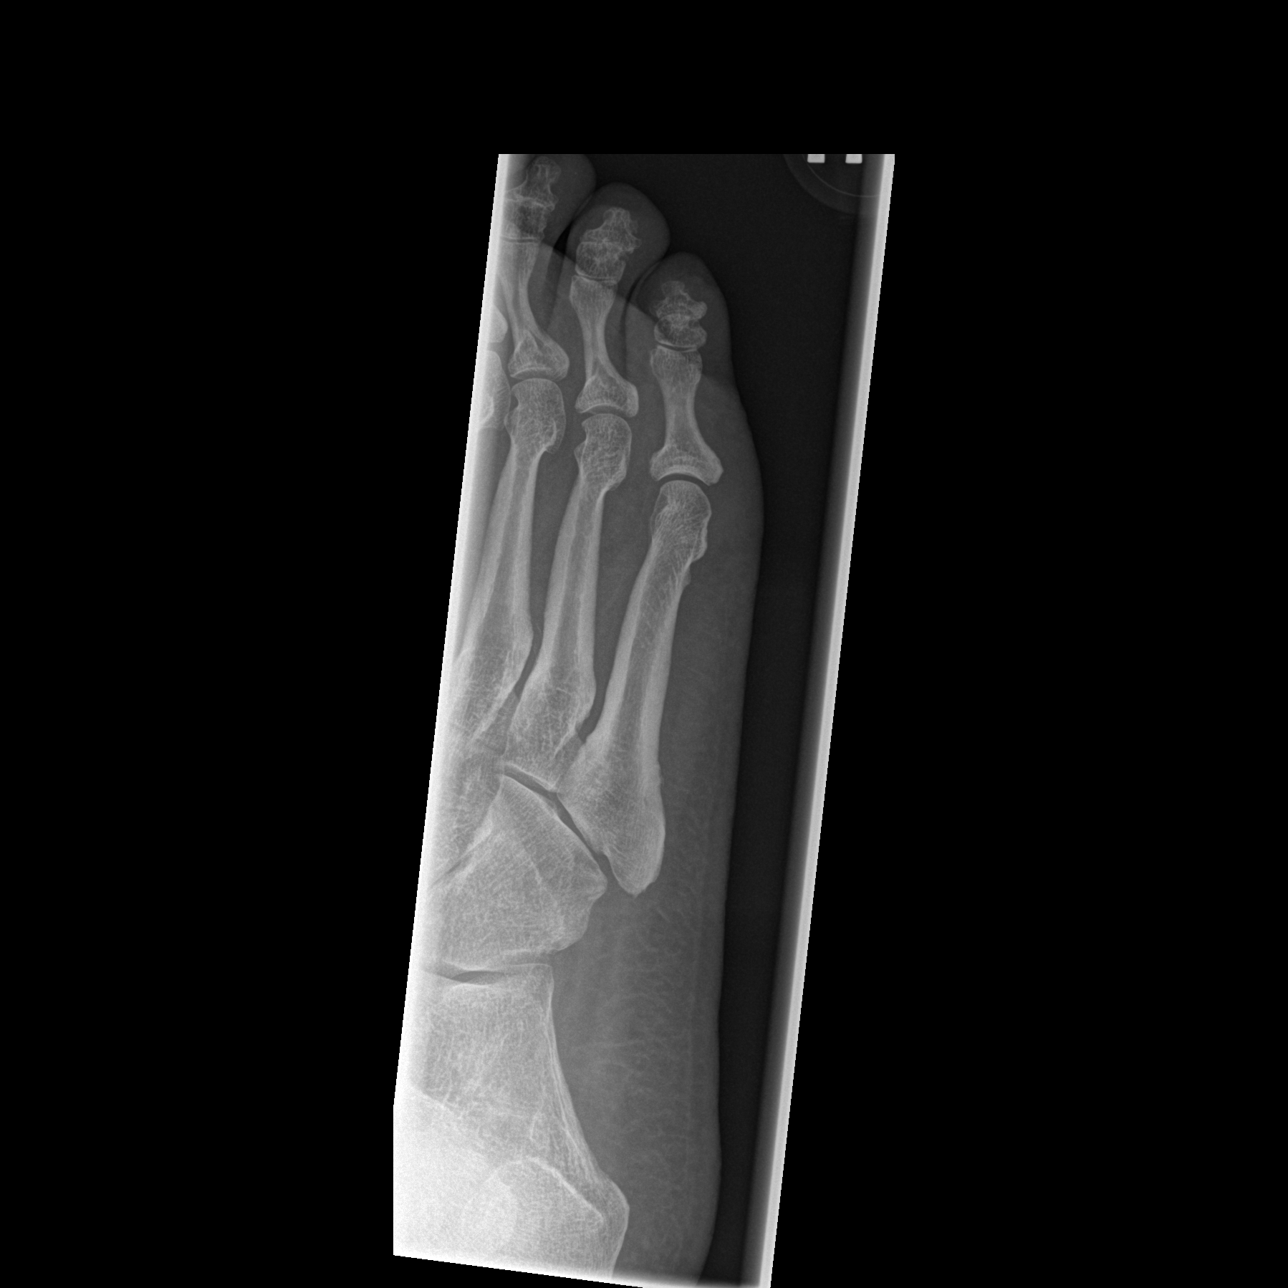

[t toes lateral right]
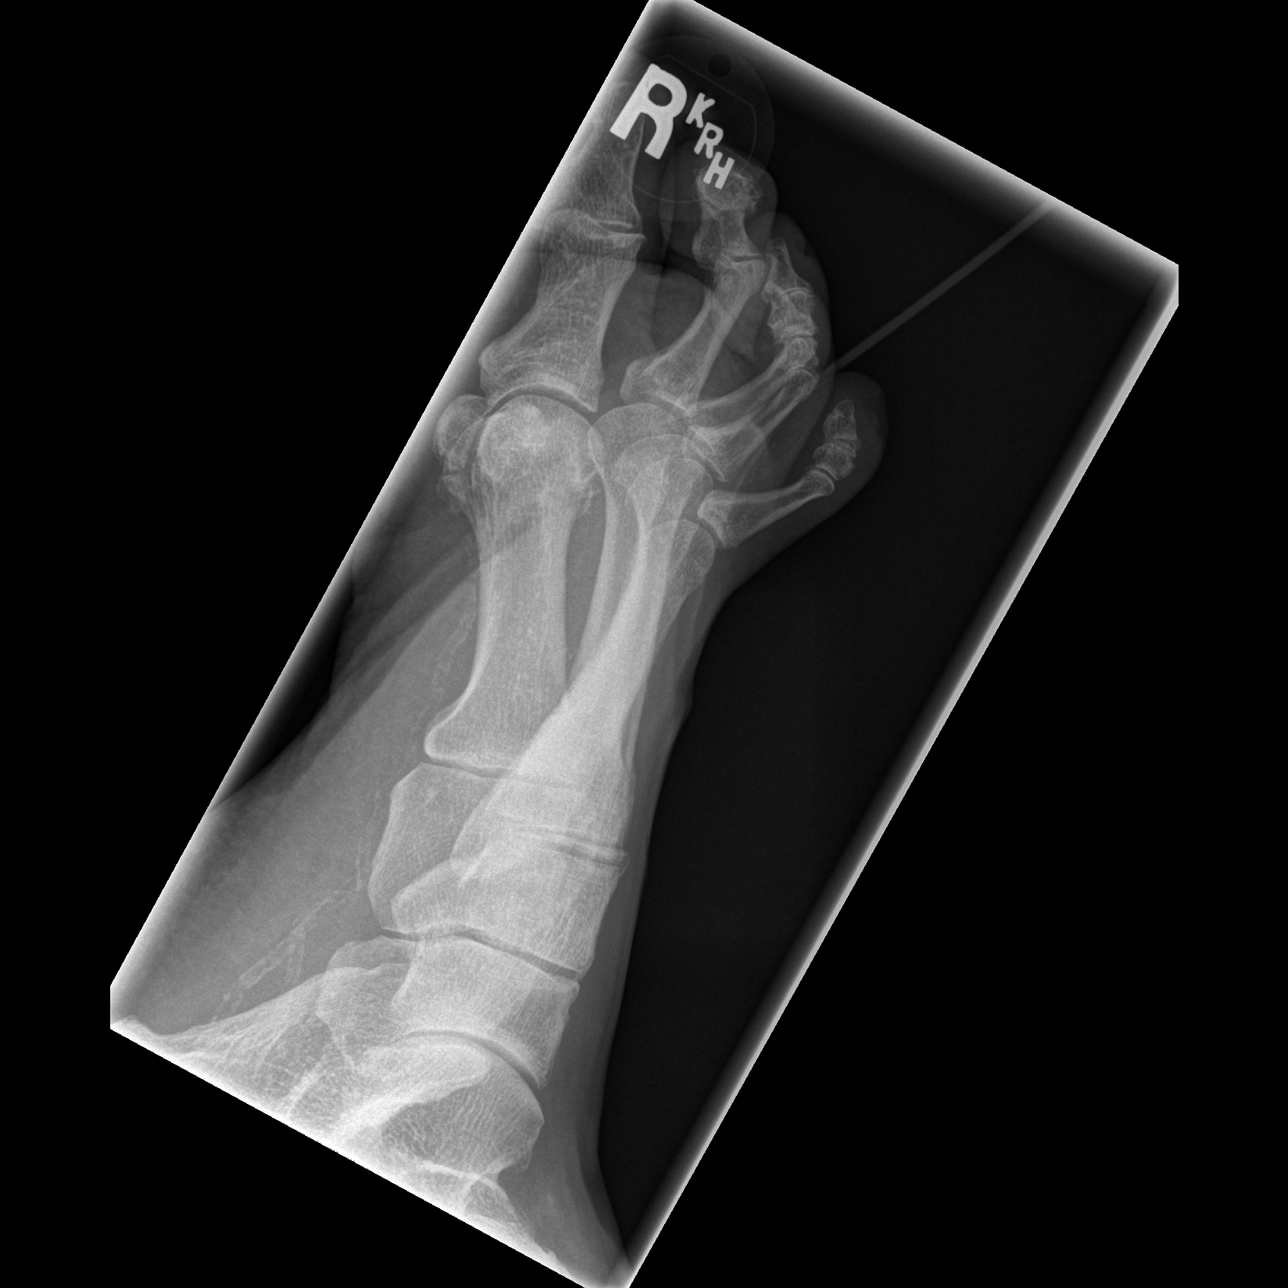

[3 of 3 positions shown; findings below may reference images not displayed]

FINDINGS: There is no evidence of fracture or dislocation. There is no
evidence of arthropathy or other focal bone abnormality. Soft
tissues are unremarkable.
IMPRESSION: Negative exam.
